# Patient Record
Sex: Female | Born: 1970 | Race: White | Hispanic: No | Marital: Married | Smoking: Current every day smoker
Health system: Southern US, Community
[De-identification: ages and names within clinical notes are randomized; demographics above are authoritative.]

## PROBLEM LIST (undated history)

## (undated) DIAGNOSIS — I219 Acute myocardial infarction, unspecified: Secondary | ICD-10-CM

## (undated) HISTORY — PX: APPENDECTOMY: SHX54

## (undated) HISTORY — PX: ABDOMINAL HYSTERECTOMY: SHX81

---

## 2016-09-02 ENCOUNTER — Encounter: Payer: Self-pay | Admitting: Emergency Medicine

## 2016-09-02 ENCOUNTER — Emergency Department: Payer: Self-pay

## 2016-09-02 ENCOUNTER — Emergency Department
Admission: EM | Admit: 2016-09-02 | Discharge: 2016-09-02 | Disposition: A | Discharge status: Home / Self Care | Payer: Self-pay | Attending: Emergency Medicine | Admitting: Emergency Medicine

## 2016-09-02 DIAGNOSIS — F17200 Nicotine dependence, unspecified, uncomplicated: Secondary | ICD-10-CM | POA: Insufficient documentation

## 2016-09-02 DIAGNOSIS — R0789 Other chest pain: Secondary | ICD-10-CM | POA: Insufficient documentation

## 2016-09-02 DIAGNOSIS — Z5321 Procedure and treatment not carried out due to patient leaving prior to being seen by health care provider: Secondary | ICD-10-CM | POA: Insufficient documentation

## 2016-09-02 HISTORY — DX: Acute myocardial infarction, unspecified: I21.9

## 2016-09-02 LAB — CBC
HEMATOCRIT: 44.4 % (ref 35.0–47.0)
HEMOGLOBIN: 14.8 g/dL (ref 12.0–16.0)
MCH: 31.3 pg (ref 26.0–34.0)
MCHC: 33.5 g/dL (ref 32.0–36.0)
MCV: 93.5 fL (ref 80.0–100.0)
Platelets: 265 10*3/uL (ref 150–440)
RBC: 4.75 MIL/uL (ref 3.80–5.20)
RDW: 14.1 % (ref 11.5–14.5)
WBC: 15.4 10*3/uL — ABNORMAL HIGH (ref 3.6–11.0)

## 2016-09-02 LAB — BASIC METABOLIC PANEL
ANION GAP: 8 (ref 5–15)
BUN: 9 mg/dL (ref 6–20)
CALCIUM: 9.6 mg/dL (ref 8.9–10.3)
CHLORIDE: 105 mmol/L (ref 101–111)
CO2: 26 mmol/L (ref 22–32)
Creatinine, Ser: 0.86 mg/dL (ref 0.44–1.00)
GFR calc non Af Amer: >60 mL/min (ref >60)
Glucose, Bld: 109 mg/dL — ABNORMAL HIGH (ref 65–99)
POTASSIUM: 4.2 mmol/L (ref 3.5–5.1)
Sodium: 139 mmol/L (ref 135–145)

## 2016-09-02 LAB — TROPONIN I

## 2016-09-02 NOTE — ED Triage Notes (Signed)
Pt ambulatory to triage in NAD, reports right sided chest pain x 7 hours, reports feels like pressure.  Pt states chest pain in past, once was "minor" MI, other time chest wall pain.  Pt denies accompanying sx.

## 2016-09-03 ENCOUNTER — Telehealth: Payer: Self-pay | Admitting: Emergency Medicine

## 2016-09-03 NOTE — Telephone Encounter (Signed)
Called patient due to lwot to inquire about condition and follow up plans. Left message.   

## 2016-09-14 ENCOUNTER — Encounter: Payer: Self-pay | Admitting: Emergency Medicine

## 2016-09-14 ENCOUNTER — Emergency Department
Admission: EM | Admit: 2016-09-14 | Discharge: 2016-09-14 | Disposition: A | Discharge status: Home / Self Care | Payer: Self-pay | Attending: Emergency Medicine | Admitting: Emergency Medicine

## 2016-09-14 DIAGNOSIS — F17210 Nicotine dependence, cigarettes, uncomplicated: Secondary | ICD-10-CM | POA: Insufficient documentation

## 2016-09-14 DIAGNOSIS — J9801 Acute bronchospasm: Secondary | ICD-10-CM | POA: Insufficient documentation

## 2016-09-14 MED ORDER — HYDROCOD POLST-CPM POLST ER 10-8 MG/5ML PO SUER: 5.0000 mL | Freq: Two times a day (BID) | ORAL | 0 refills | Status: AC

## 2016-09-14 MED ORDER — METHYLPREDNISOLONE 4 MG PO TBPK: ORAL_TABLET | ORAL | 0 refills | Status: AC

## 2016-09-14 NOTE — ED Triage Notes (Signed)
Pt to ed with c/o cough, congestion and sore body aches x 5 days. Denies fever.

## 2016-09-14 NOTE — ED Provider Notes (Signed)
[facility] Emergency Department Provider Note   ____________________________________________   None    (approximate)  I have reviewed the triage vital signs and the nursing notes.   HISTORY  Chief Complaint Cough    HPI Stephanie Browning is a 46 y.o. female is complaining of 5 days of nonproductive cough. Patient is congested that a chest. He denies fever or nausea vomiting diarrhea. Patient stated no relief with over-the-counter cough preparations. Patient's the cough is post least vomiting. Patient rates pain as 10 over 10. No other palliative measures for complaint. Patient has history asthma.  Past Medical History:  Diagnosis Date  . Myocardial infarction     There are no active problems to display for this patient.   Past Surgical History:  Procedure Laterality Date  . ABDOMINAL HYSTERECTOMY    . APPENDECTOMY      Prior to Admission medications   Medication Sig Start Date End Date Taking? Authorizing Provider  chlorpheniramine-HYDROcodone (TUSSIONEX PENNKINETIC ER) 10-8 MG/5ML SUER Take 5 mLs by mouth 2 (two) times daily. 09/14/16   Joni Reining, PA-C  methylPREDNISolone (MEDROL DOSEPAK) 4 MG TBPK tablet Take Tapered dose as directed 09/14/16   Joni Reining, PA-C    Allergies Penicillins  History reviewed. No pertinent family history.  Social History Social History  Substance Use Topics  . Smoking status: Current Every Day Smoker    Types: Cigarettes  . Smokeless tobacco: Never Used  . Alcohol use No    Review of Systems Constitutional: No fever/chills Eyes: No visual changes. ENT: No sore throat. Cardiovascular: Denies chest pain. Respiratory: Denies shortness of breath. Gastrointestinal: No abdominal pain.  No nausea, no vomiting.  No diarrhea.  No constipation.Nonproductive cough Genitourinary: Negative for dysuria. Musculoskeletal: Negative for back pain. Skin: Negative for rash. Neurological: Negative for headaches,  focal weakness or numbness.    ____________________________________________   PHYSICAL EXAM:  VITAL SIGNS: ED Triage Vitals [09/14/16 0802]  Enc Vitals Group     BP 119/78     Pulse Rate 88     Resp 20     Temp 98.4 F (36.9 C)     Temp Source Oral     SpO2 100 %     Weight 165 lb (74.8 kg)     Height 5\' 1"  (1.549 m)     Head Circumference      Peak Flow      Pain Score 10     Pain Loc      Pain Edu?      Excl. in GC?    Constitutional: Alert and oriented. Well appearing and in no acute distress. Eyes: Conjunctivae are normal. PERRL. EOMI. Head: Atraumatic. Nose: No congestion/rhinnorhea. Mouth/Throat: Mucous membranes are moist.  Oropharynx non-erythematous. Neck: No stridor. No cervical spine tenderness to palpation. Hematological/Lymphatic/Immunilogical: No cervical lymphadenopathy. Cardiovascular: Normal rate, regular rhythm. Grossly normal heart sounds.  Good peripheral circulation. Respiratory: Normal respiratory effort.  No retractions. Lungs CTAB. Gastrointestinal: Soft and nontender. No distention. No abdominal bruits. No CVA tenderness. Musculoskeletal: No lower extremity tenderness nor edema.  No joint effusions. Neurologic:  Normal speech and language. No gross focal neurologic deficits are appreciated. No gait instability. Skin:  Skin is warm, dry and intact. No rash noted. Psychiatric: Mood and affect are normal. Speech and behavior are normal.  ____________________________________________   LABS (all labs ordered are listed, but only abnormal results are displayed)  Labs Reviewed - No data to display ____________________________________________  EKG   ____________________________________________  RADIOLOGY   ____________________________________________   PROCEDURES  Procedure(s) performed: None  Procedures  Critical Care performed: No  ____________________________________________   INITIAL IMPRESSION / ASSESSMENT AND PLAN / ED  COURSE  Pertinent labs & imaging results that were available during my care of the patient were reviewed by me and considered in my medical decision making (see chart for details).  Pt is a 46 y.o. Female who presents with complaints of cough. Her history and physical examination is most consistent with a viral upper respiratory illness. Her persistent cough is most likely due to bronchospasms given her history of asthma. Infectious process was also considered but unlikely as she is afebrile with an unremarkable examination. Patient given a prescription for Tussionex and Medrol Dosepak. Patient advised follow "clinic.     ____________________________________________   FINAL CLINICAL IMPRESSION(S) / ED DIAGNOSES  Final diagnoses:  Cough due to bronchospasm      NEW MEDICATIONS STARTED DURING THIS VISIT:  New Prescriptions   CHLORPHENIRAMINE-HYDROCODONE (TUSSIONEX PENNKINETIC ER) 10-8 MG/5ML SUER    Take 5 mLs by mouth 2 (two) times daily.   METHYLPREDNISOLONE (MEDROL DOSEPAK) 4 MG TBPK TABLET    Take Tapered dose as directed     Note:  This document was prepared using Dragon voice recognition software and may include unintentional dictation errors.    Joni Reiningonald K Dshaun Reppucci, PA-C 09/14/16 [id number]    Nita Sicklearolina Veronese, MD 09/17/16 [phone]

## 2016-09-14 NOTE — ED Notes (Signed)
See triage note   States she developed a non prod cough about 5 days ago.  Feels likes she has congestion in chest denies any fever

## 2016-11-15 ENCOUNTER — Emergency Department
Admission: EM | Admit: 2016-11-15 | Discharge: 2016-11-15 | Disposition: A | Discharge status: Home / Self Care | Payer: Self-pay | Attending: Emergency Medicine | Admitting: Emergency Medicine

## 2016-11-15 ENCOUNTER — Emergency Department: Payer: Self-pay

## 2016-11-15 DIAGNOSIS — I252 Old myocardial infarction: Secondary | ICD-10-CM | POA: Insufficient documentation

## 2016-11-15 DIAGNOSIS — F17210 Nicotine dependence, cigarettes, uncomplicated: Secondary | ICD-10-CM | POA: Insufficient documentation

## 2016-11-15 DIAGNOSIS — M5432 Sciatica, left side: Secondary | ICD-10-CM | POA: Insufficient documentation

## 2016-11-15 MED ORDER — PREDNISONE 10 MG (21) PO TBPK: ORAL_TABLET | ORAL | 0 refills | Status: AC

## 2016-11-15 MED ORDER — OXYCODONE-ACETAMINOPHEN 5-325 MG PO TABS
1.0000 | ORAL_TABLET | Freq: Once | ORAL | Status: AC
  Administered 2016-11-15: 1 via ORAL
  Filled 2016-11-15: qty 1

## 2016-11-15 MED ORDER — CYCLOBENZAPRINE HCL 5 MG PO TABS: 5.0000 mg | ORAL_TABLET | Freq: Three times a day (TID) | ORAL | 0 refills | Status: AC | PRN

## 2016-11-15 NOTE — ED Provider Notes (Signed)
[facility] Emergency Department Provider Note  ____________________________________________  Time seen: Approximately 3:53 PM  I have reviewed the triage vital signs and the nursing notes.   HISTORY  Chief Complaint Leg Pain    HPI Stephanie Browning is a 46 y.o. female that presents to the emergency department withleft leg pain for one day. She states that she has a history of low back pain but pain is not usually in her leg. She describes the pain as cramping and is primarily on the back and on the outside. She is unsure if her leg is swelling. She states that she has 3 herniated disks. She is trying to get into pain management but due to insurance reasons she does not have a PCP yet. She smokes cigarettes. No recent surgeries. She occasionally takes muscle relaxers but does not have any left. She denies fever, shortness of breath, chest pain, nausea, vomiting, abdominal pain, dysuria, urgency, frequency, saddle paresthesias, bowel or bladder dysfunction, numbness, tingling.   Past Medical History:  Diagnosis Date  . Myocardial infarction (HCC)     There are no active problems to display for this patient.   Past Surgical History:  Procedure Laterality Date  . ABDOMINAL HYSTERECTOMY    . APPENDECTOMY      Prior to Admission medications   Medication Sig Start Date End Date Taking? Authorizing Provider  chlorpheniramine-HYDROcodone (TUSSIONEX PENNKINETIC ER) 10-8 MG/5ML SUER Take 5 mLs by mouth 2 (two) times daily. 09/14/16   Joni Reining, PA-C  cyclobenzaprine (FLEXERIL) 5 MG tablet Take 1 tablet (5 mg total) by mouth 3 (three) times daily as needed for muscle spasms. 11/15/16 11/22/16  Enid Derry, PA-C  methylPREDNISolone (MEDROL DOSEPAK) 4 MG TBPK tablet Take Tapered dose as directed 09/14/16   Joni Reining, PA-C  predniSONE (STERAPRED UNI-PAK 21 TAB) 10 MG (21) TBPK tablet Take 6 tablets on day 1, take 5 tablets on day 2, take 4 tablets on day 3, take  3 tablets on day 4, take 2 tablets on day 5, take 1 tablet on day 6 11/15/16   Enid Derry, PA-C    Allergies Penicillins  History reviewed. No pertinent family history.  Social History Social History  Substance Use Topics  . Smoking status: Current Every Day Smoker    Types: Cigarettes  . Smokeless tobacco: Never Used  . Alcohol use No     Review of Systems  Constitutional: No fever/chills ENT: No upper respiratory complaints. Cardiovascular: No chest pain. Respiratory: No cough. No SOB. Gastrointestinal: No abdominal pain.  No nausea, no vomiting.  Genitourinary: Negative for dysuria. Skin: Negative for rash, abrasions, lacerations, ecchymosis. Neurological: Negative for headaches, numbness or tingling   ____________________________________________   PHYSICAL EXAM:  VITAL SIGNS: ED Triage Vitals  Enc Vitals Group     BP 11/15/16 1015 119/81     Pulse Rate 11/15/16 1015 78     Resp 11/15/16 1015 18     Temp 11/15/16 1015 98.5 F (36.9 C)     Temp Source 11/15/16 1015 Oral     SpO2 11/15/16 1015 98 %     Weight 11/15/16 1015 165 lb (74.8 kg)     Height 11/15/16 1015  (1.549 m)     Head Circumference --      Peak Flow --      Pain Score 11/15/16 1021 10     Pain Loc --      Pain Edu? --      Excl. in  GC? --      Eyes: Conjunctivae are normal. PERRL. EOMI. Head: Atraumatic. ENT:      Ears:      Nose: No congestion/rhinnorhea.      Mouth/Throat: Mucous membranes are moist.  Neck: No stridor.   Cardiovascular: Normal rate, regular rhythm.  Good peripheral circulation. Respiratory: Normal respiratory effort without tachypnea or retractions. Lungs CTAB. Good air entry to the bases with no decreased or absent breath sounds. Gastrointestinal: Bowel sounds 4 quadrants. Soft and nontender to palpation.  Musculoskeletal: Full range of motion to all extremities. No gross deformities appreciated. No tenderness to palpation over lower back. Neurologic:   Normal speech and language. No gross focal neurologic deficits are appreciated.  Skin:  Skin is warm, dry and intact. No rash noted.   ____________________________________________   LABS (all labs ordered are listed, but only abnormal results are displayed)  Labs Reviewed - No data to display ____________________________________________  EKG   ____________________________________________  RADIOLOGY   US Venous Img Lower Unilateral Left  Result Date: 11/15/2016 CLINICAL DATA:  Left lower extremity pain since early this morning. EXAM: LEFT LOWER EXTREMITY VENOUS DOPPLER ULTRASOUND TECHNIQUE: Gray-scale sonography with graded compression, as well as color Doppler and duplex ultrasound were performed to evaluate the lower extremity deep venous systems from the level of the common femoral vein and including the common femoral, femoral, profunda femoral, popliteal and calf veins including the posterior tibial, peroneal and gastrocnemius veins when visible. The superficial great saphenous vein was also interrogated. Spectral Doppler was utilized to evaluate flow at rest and with distal augmentation maneuvers in the common femoral, femoral and popliteal veins. COMPARISON:  None. FINDINGS: Contralateral Common Femoral Vein: Respiratory phasicity is normal and symmetric with the symptomatic side. No evidence of thrombus. Normal compressibility. Common Femoral Vein: No evidence of thrombus. Normal compressibility, respiratory phasicity and response to augmentation. Saphenofemoral Junction: No evidence of thrombus. Normal compressibility and flow on color Doppler imaging. Profunda Femoral Vein: No evidence of thrombus. Normal compressibility and flow on color Doppler imaging. Femoral Vein: No evidence of thrombus. Normal compressibility, respiratory phasicity and response to augmentation. Popliteal Vein: No evidence of thrombus. Normal compressibility, respiratory phasicity and response to augmentation.  Calf Veins: No evidence of thrombus. Normal compressibility and flow on color Doppler imaging. Superficial Great Saphenous Vein: No evidence of thrombus. Normal compressibility and flow on color Doppler imaging. Venous Reflux:  None. Other Findings:  None. IMPRESSION: No evidence of deep venous thrombosis. Electronically Signed   By: Amie Portland M.D.   On: 11/15/2016 12:38    ____________________________________________    PROCEDURES  Procedure(s) performed:    Procedures    Medications  oxyCODONE-acetaminophen (PERCOCET/ROXICET) 5-325 MG per tablet 1 tablet (1 tablet Oral Given 11/15/16 1151)     ____________________________________________   INITIAL IMPRESSION / ASSESSMENT AND PLAN / ED COURSE  Pertinent labs & imaging results that were available during my care of the patient were reviewed by me and considered in my medical decision making (see chart for details).  Review of the [place] CSRS was performed in accordance of the [facility] prior to dispensing any controlled drugs.     Patient's diagnosis is consistent with Sciatica. Vital signs and exam are reassuring. No indication of DVT on ultrasound. Patient felt better after Percocet. Patient will be discharged home with prescriptions for prednisone and Flexeril. Patient is to follow up with PCP as directed. Patient is given ED precautions to return to the ED for any worsening or new symptoms.  ____________________________________________  FINAL CLINICAL IMPRESSION(S) / ED DIAGNOSES  Final diagnoses:  Sciatica of left side      NEW MEDICATIONS STARTED DURING THIS VISIT:  Discharge Medication List as of 11/15/2016  2:05 PM    START taking these medications   Details  cyclobenzaprine (FLEXERIL) 5 MG tablet Take 1 tablet (5 mg total) by mouth 3 (three) times daily as needed for muscle spasms., Starting Sun 11/15/2016, Until Sun 11/22/2016, Print    predniSONE (STERAPRED UNI-PAK 21 TAB) 10 MG (21) TBPK tablet Take 6  tablets on day 1, take 5 tablets on day 2, take 4 tablets on day 3, take 3 tablets on day 4, take 2 tablets on day 5, take 1 tablet on day 6, Print            This chart was dictated using voice recognition software/Dragon. Despite best efforts to proofread, errors can occur which can change the meaning. Any change was purely unintentional.    Enid Derry, PA-C 11/15/16 1558    Arnaldo Natal, MD 11/17/16 Mikle Bosworth

## 2016-11-15 NOTE — ED Notes (Signed)
See triage note  Having pain to left hip/leg  States she has a hx of same  Denies recent injury  Awaiting to get into pain management

## 2016-11-15 NOTE — ED Notes (Signed)
Back from US.

## 2016-11-15 NOTE — ED Triage Notes (Signed)
Pt c/o left leg pain starting this am. Denies injury. Reports hx herniated disk. Denies right leg pain. Pt states no swelling noted.

## 2018-03-24 IMAGING — CR DG CHEST 2V
1 series · 2 of 2 positions shown · non-contrast
Comparison: None.

CLINICAL DATA: Chest pain.

EXAM:
CHEST  2 VIEW

[Series 1: dg chest 2 view · 0.14mm/px · 2 of 2 slices shown]
[im 1/2]
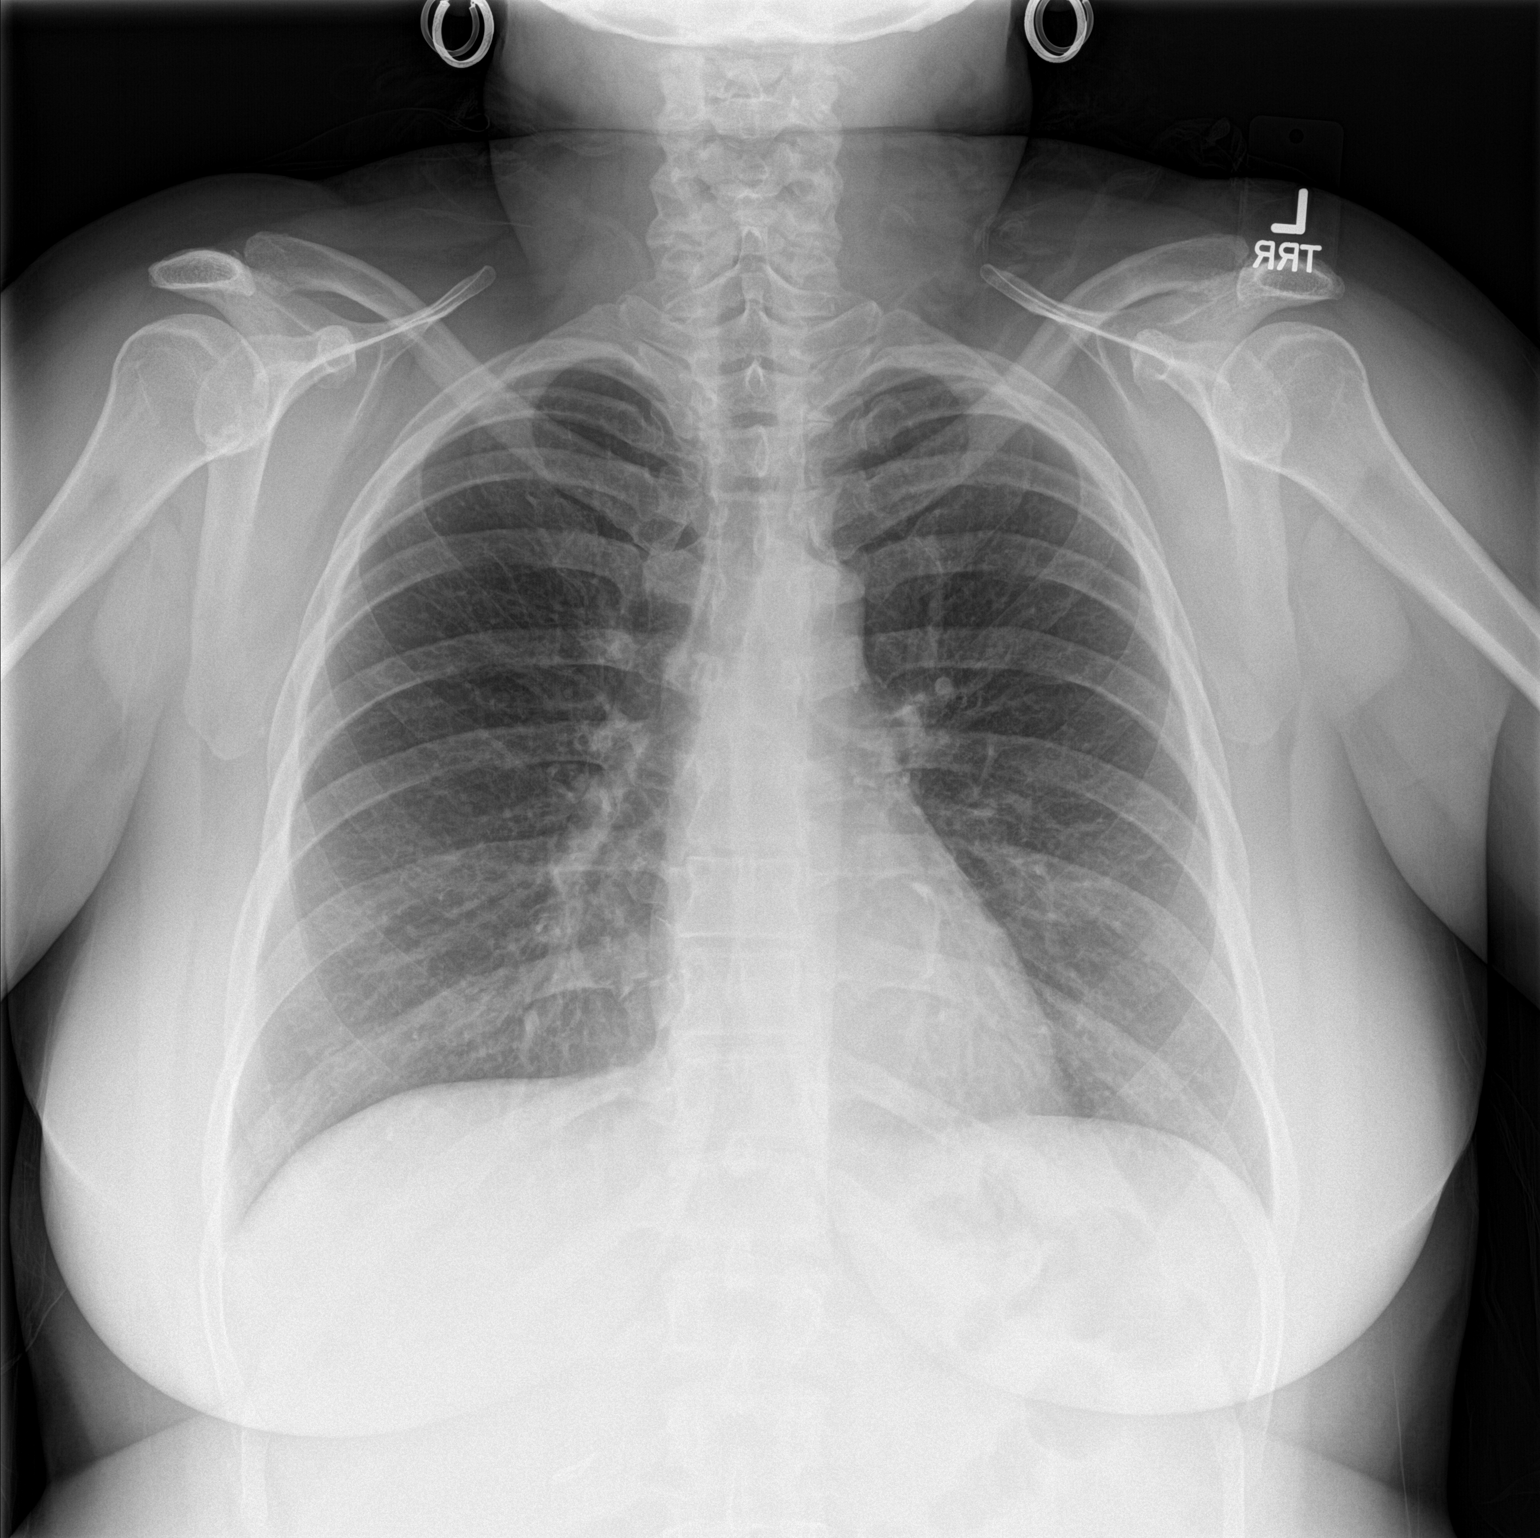
[im 2/2]
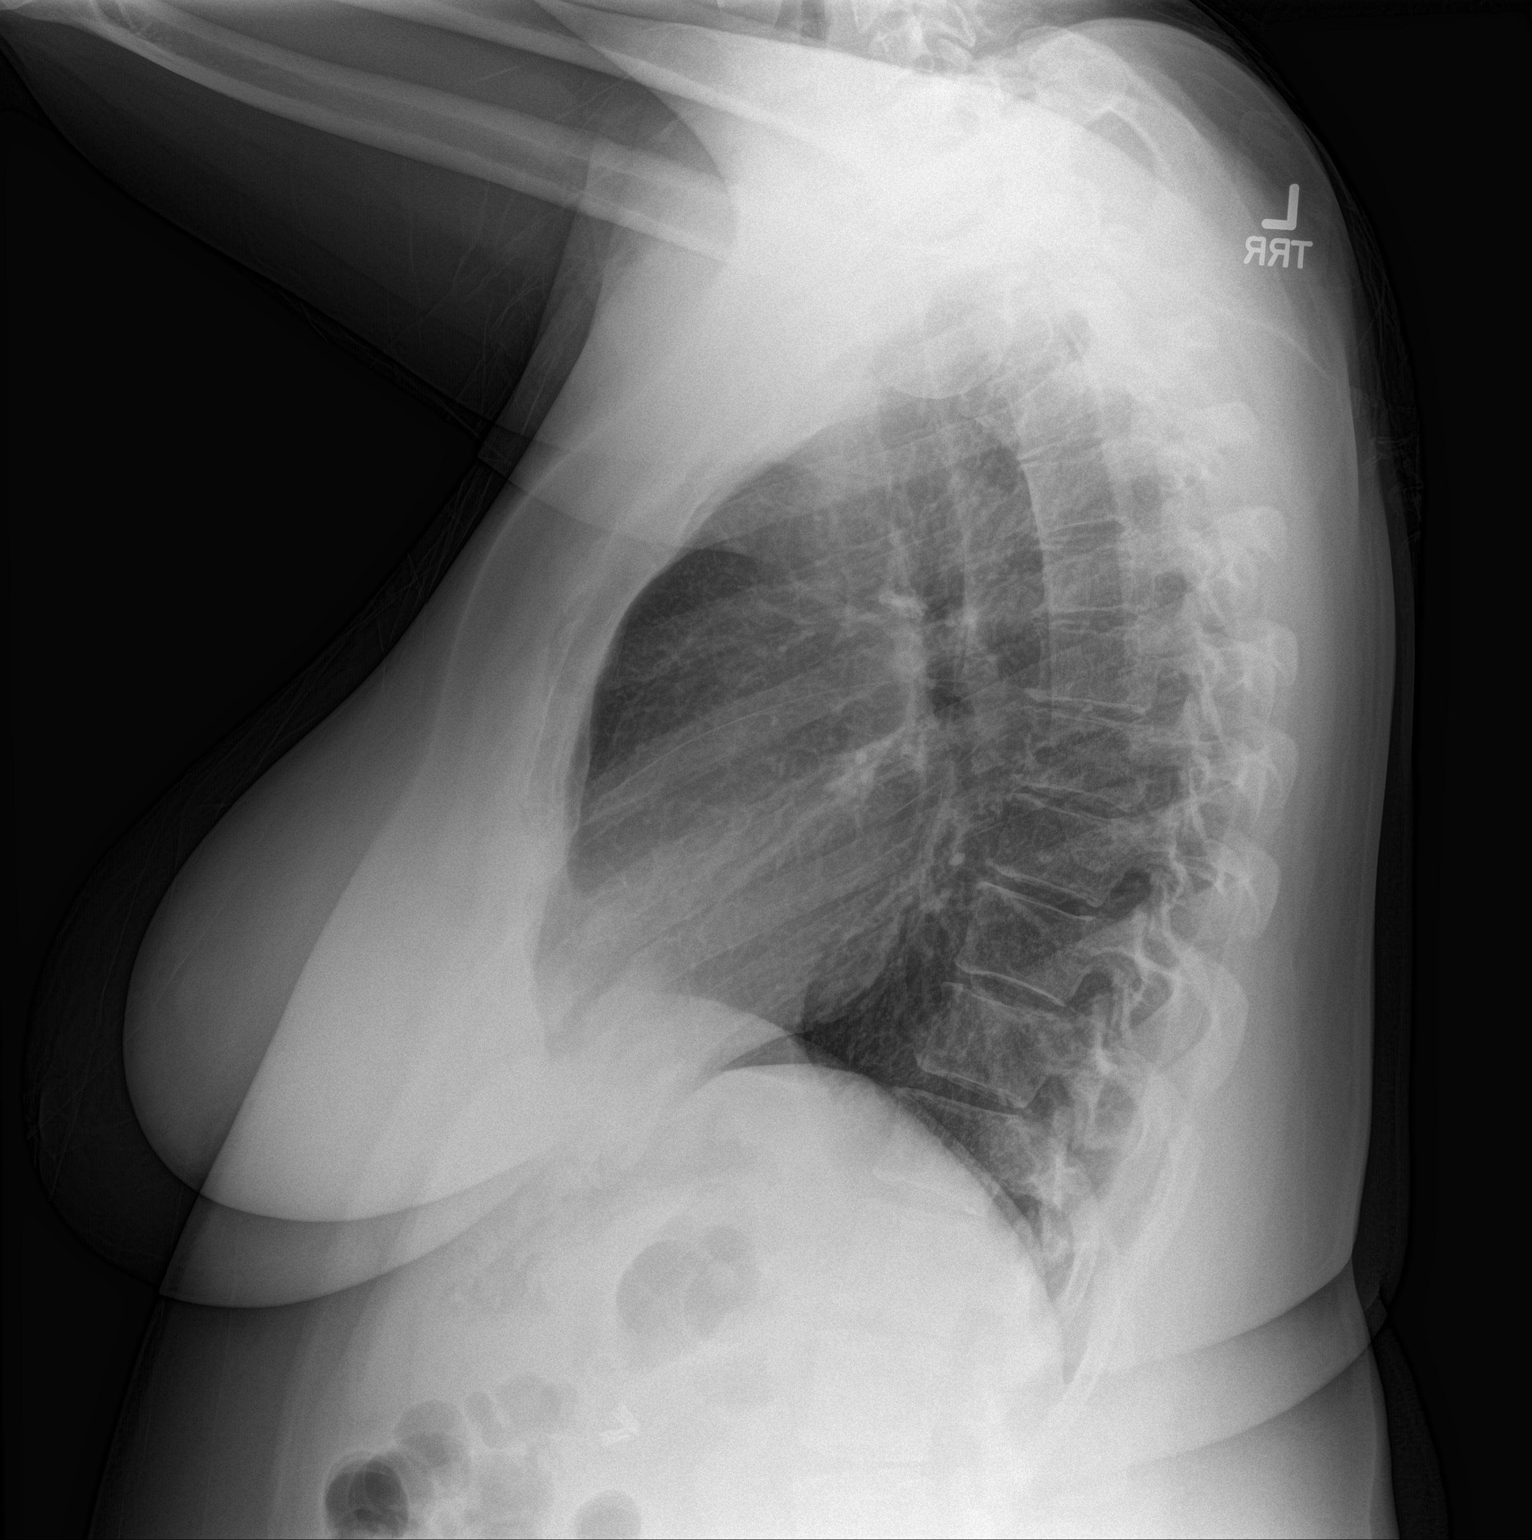

[2 of 2 positions shown; findings below may reference images not displayed]

FINDINGS: The heart size and mediastinal contours are within normal limits.
Both lungs are clear. No pneumothorax or pleural effusion is noted.
The visualized skeletal structures are unremarkable.
IMPRESSION: No active cardiopulmonary disease.

## 2019-01-06 IMAGING — US US EXTREM LOW VENOUS*L*
1 series · 13 of 24 positions shown · non-contrast
Comparison: None.

CLINICAL DATA: Left lower extremity pain since early this morning.



[Series 1: us extrem low venous*left* · 0.07mm/px · 13 of 34 slices shown]
[im 1/34]
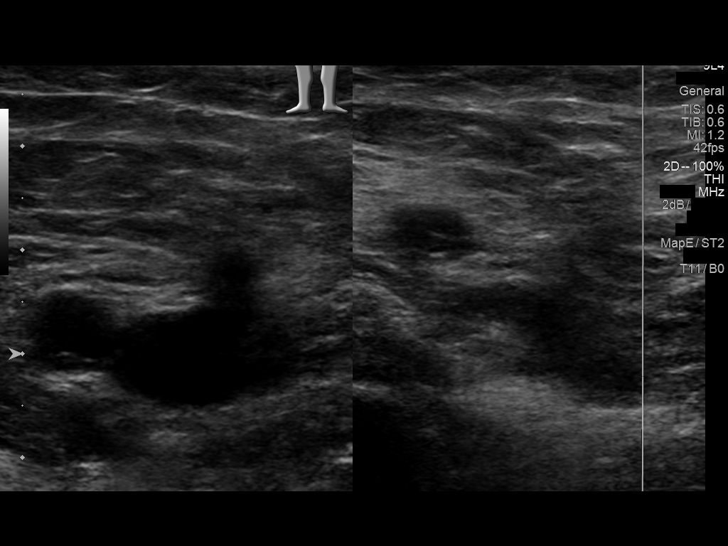
[im 3/34]
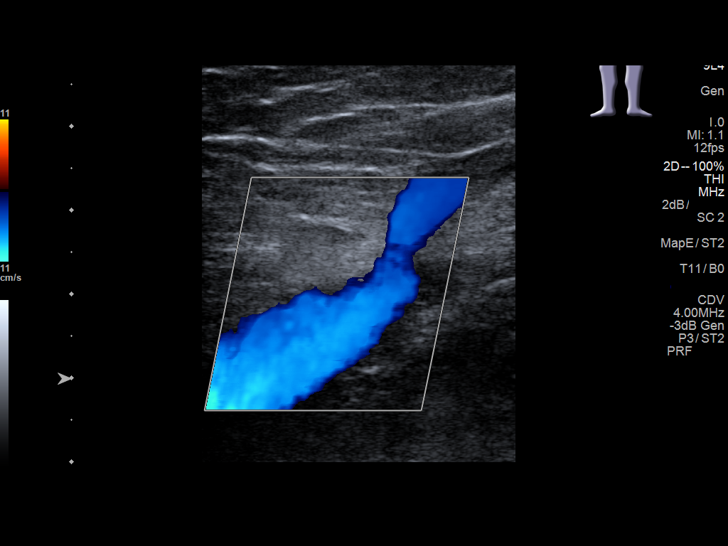
[im 6/34]
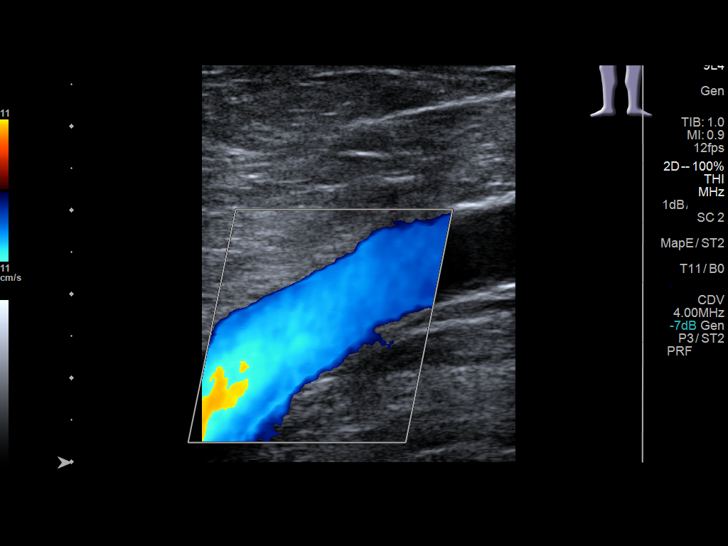
[im 9/34]
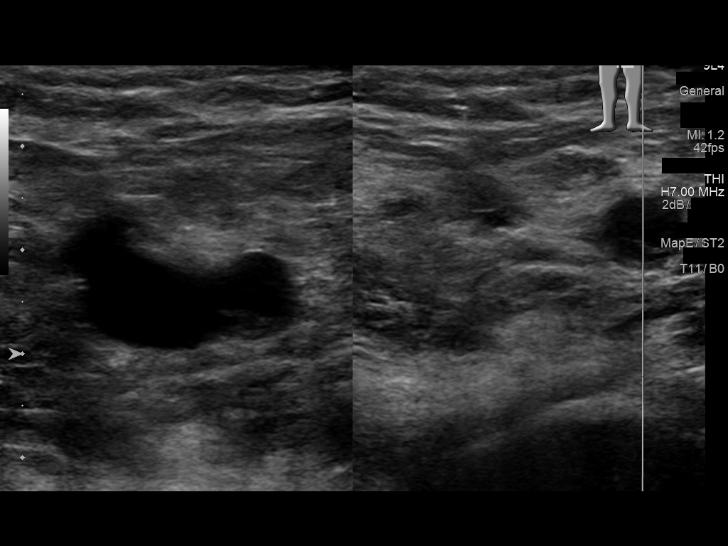
[im 12/34]
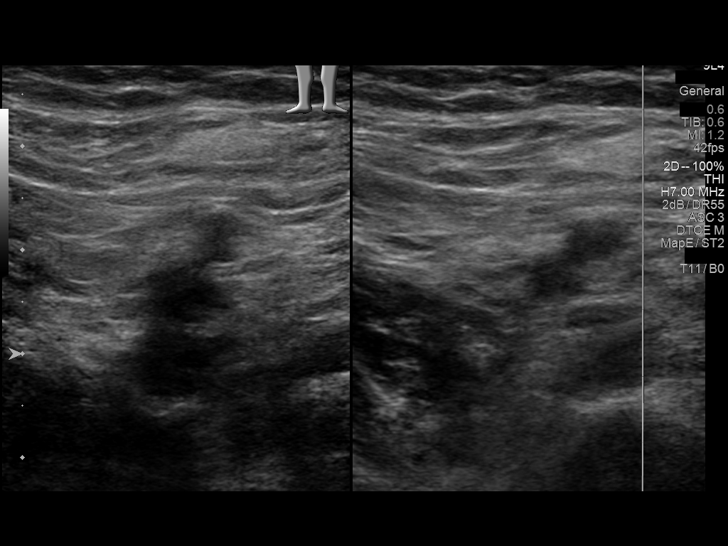
[im 15/34]
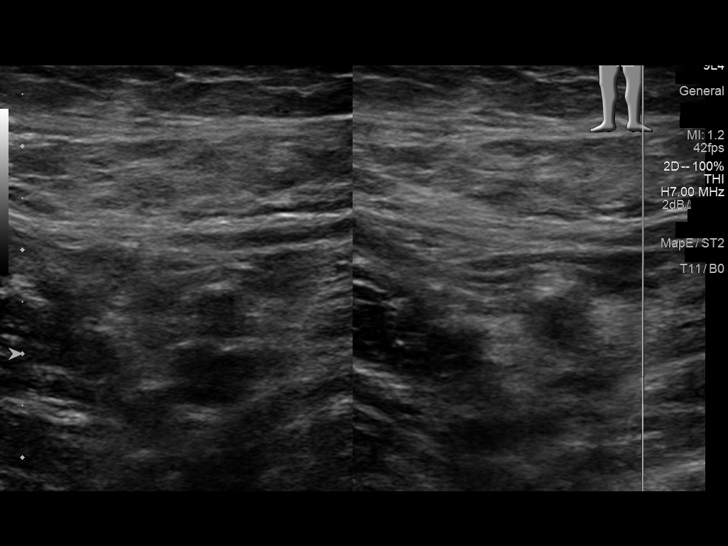
[im 18/34]
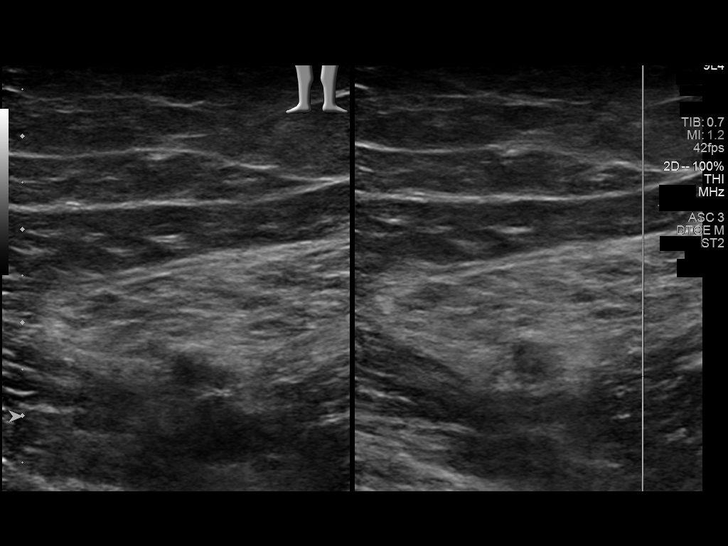
[im 19/34]
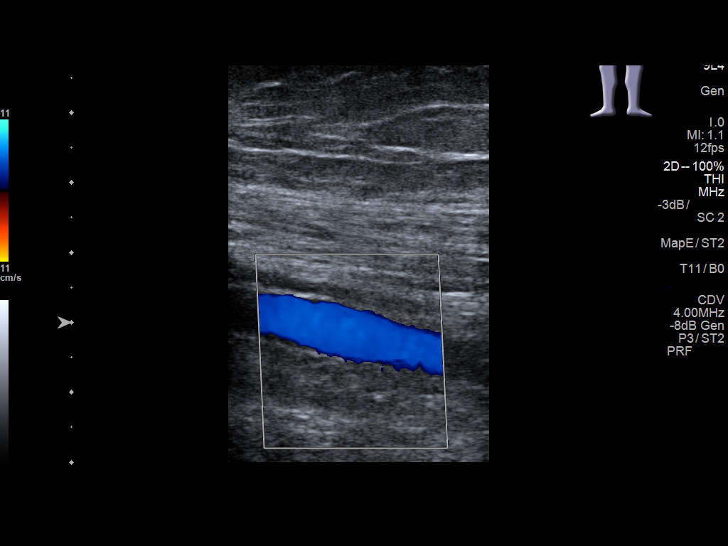
[im 22/34]
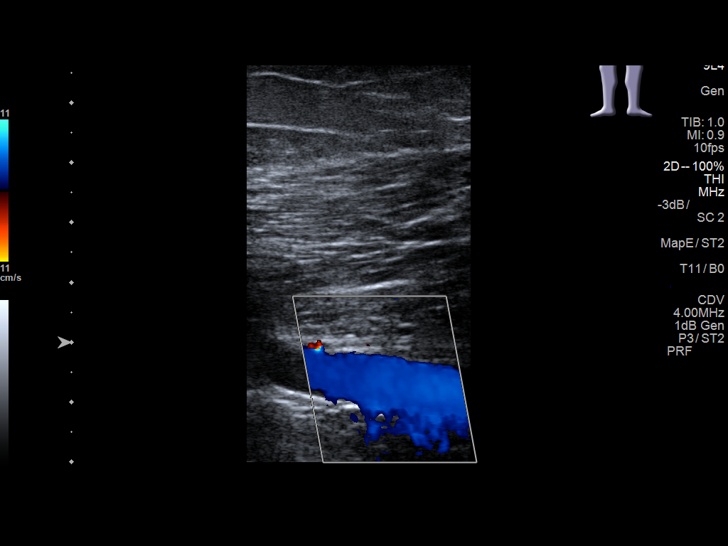
[im 25/34]
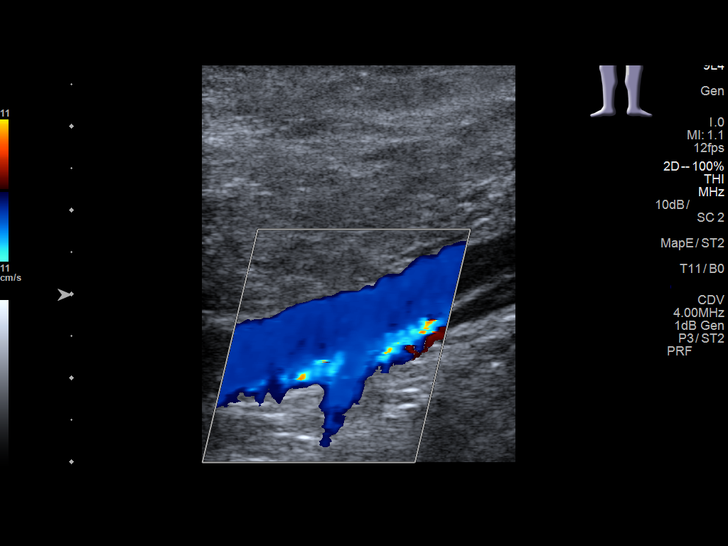
[im 28/34]
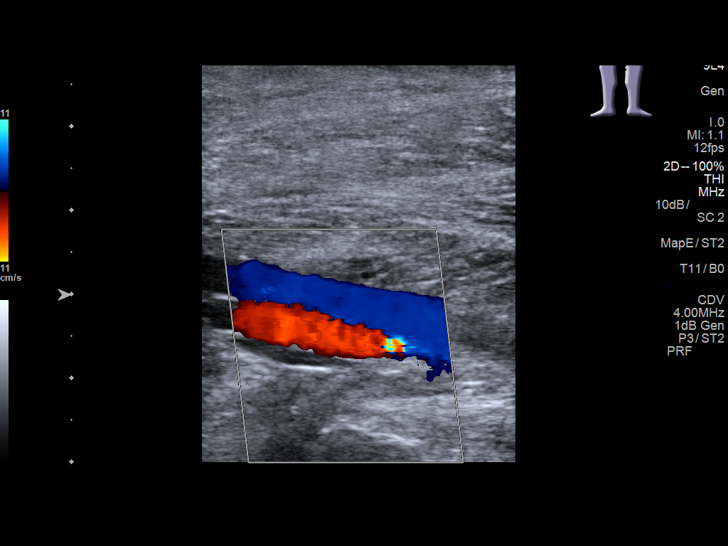
[im 31/34]
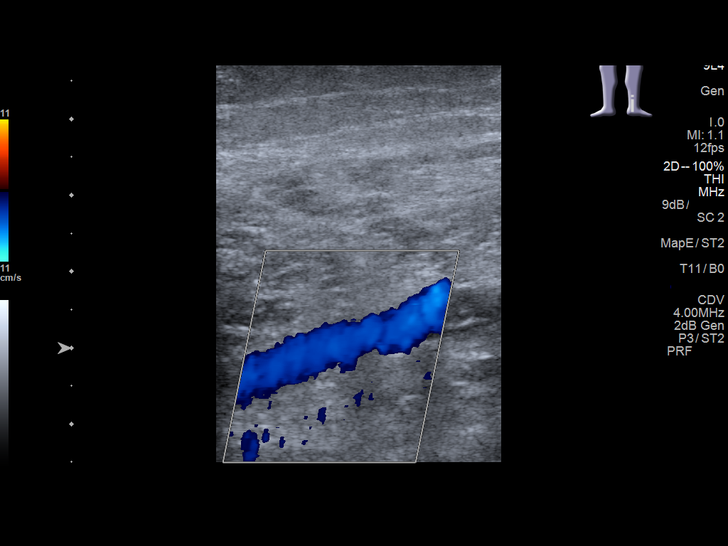
[im 34/34]
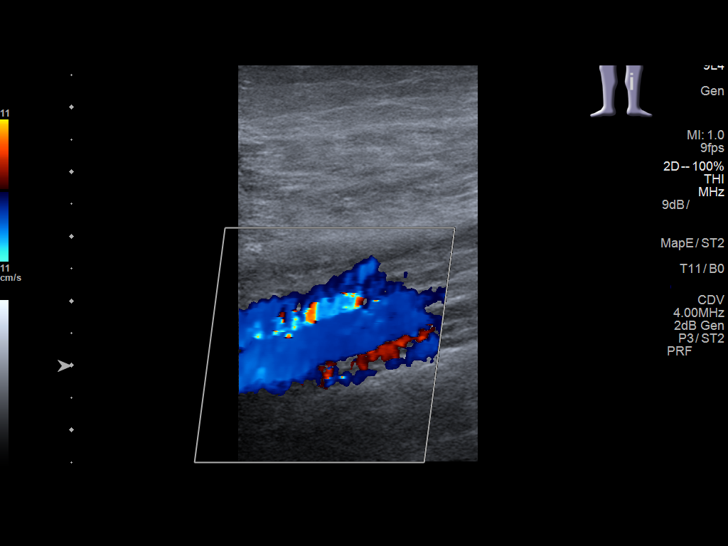

[13 of 24 positions shown; findings below may reference images not displayed]

FINDINGS: Contralateral Common Femoral Vein: Respiratory phasicity is normal
and symmetric with the symptomatic side. No evidence of thrombus.
Normal compressibility.

Common Femoral Vein: No evidence of thrombus. Normal
compressibility, respiratory phasicity and response to augmentation.

Saphenofemoral Junction: No evidence of thrombus. Normal
compressibility and flow on color Doppler imaging.

Profunda Femoral Vein: No evidence of thrombus. Normal
compressibility and flow on color Doppler imaging.

Femoral Vein: No evidence of thrombus. Normal compressibility,
respiratory phasicity and response to augmentation.

Popliteal Vein: No evidence of thrombus. Normal compressibility,
respiratory phasicity and response to augmentation.

Calf Veins: No evidence of thrombus. Normal compressibility and flow
on color Doppler imaging.

Superficial Great Saphenous Vein: No evidence of thrombus. Normal
compressibility and flow on color Doppler imaging.

Venous Reflux:  None.

Other Findings:  None.
IMPRESSION: No evidence of deep venous thrombosis.
